# Patient Record
Sex: Male | Born: 1995 | Race: Black or African American | Hispanic: No | Marital: Single | Smoking: Never smoker
Health system: Southern US, Community
[De-identification: ages and names within clinical notes are randomized; demographics above are authoritative.]

## PROBLEM LIST (undated history)

## (undated) DIAGNOSIS — K529 Noninfective gastroenteritis and colitis, unspecified: Secondary | ICD-10-CM

---

## 2021-02-17 ENCOUNTER — Encounter (HOSPITAL_COMMUNITY): Payer: Self-pay | Admitting: Emergency Medicine

## 2021-02-17 ENCOUNTER — Ambulatory Visit (HOSPITAL_COMMUNITY): Payer: Self-pay

## 2021-02-17 ENCOUNTER — Ambulatory Visit (HOSPITAL_COMMUNITY)
Admission: EM | Admit: 2021-02-17 | Discharge: 2021-02-17 | Disposition: A | Discharge status: Home / Self Care | Payer: Self-pay | Attending: Family Medicine | Admitting: Family Medicine

## 2021-02-17 ENCOUNTER — Other Ambulatory Visit: Payer: Self-pay

## 2021-02-17 DIAGNOSIS — K6289 Other specified diseases of anus and rectum: Secondary | ICD-10-CM

## 2021-02-17 DIAGNOSIS — R1032 Left lower quadrant pain: Secondary | ICD-10-CM

## 2021-02-17 HISTORY — DX: Noninfective gastroenteritis and colitis, unspecified: K52.9

## 2021-02-17 NOTE — Discharge Instructions (Signed)

## 2021-02-17 NOTE — ED Provider Notes (Signed)
[facility] CARE CENTER   [id number] 02/17/21 Arrival Time: 1449  ASSESSMENT & PLAN:  1. Abdominal pain, left lower quadrant   2. Rectal discomfort    Benign abdominal exam. No indications for urgent abdominal/pelvic imaging at this time. He initially requests abd films but later declines; discussed low yield. Discussed. Recommend GI f/u as listed below.   Discharge Instructions     You have been seen today for abdominal pain. Your evaluation was not suggestive of any emergent condition requiring medical intervention at this time. However, some abdominal problems make take more time to appear. Therefore, it is very important for you to pay attention to any new symptoms or worsening of your current condition.  Please return here or to the Emergency Department immediately should you begin to feel worse in any way or have any of the following symptoms: increasing or different abdominal pain, persistent vomiting, inability to drink fluids, fevers, or shaking chills.       Follow-up Information    Schedule an appointment as soon as possible for a visit  with [facility].   Specialty: Gastroenterology Contact information: [address] [place] [address] [phone]       [facility] EMERGENCY DEPARTMENT.   Specialty: Emergency Medicine Why: If symptoms worsen in any way. Contact information: [address] [id number] mc [place] [address] [phone]              Reviewed expectations re: course of current medical issues. Questions answered. Outlined signs and symptoms indicating need for more acute intervention. Patient verbalized understanding. After Visit Summary given.   SUBJECTIVE: History from: patient. Derrick Hines is a 25 y.o. male who presents with complaint of intermittent LLQ abdominal discomfort and intermittent rectal discomfort. Onset gradual, past 1-2 mos. Abdominal discomfort  described as dull; without radiation; does not wake him at night. Rectal discomfort "feels like bumps sometimes". Occasional bright red blood on toilet tissue after BM. Does strain with hard BMs. Reports normal flatus. Symptoms are unchanged since beginning. Fever: absent. Aggravating factors: have not been identified. Alleviating factors: have not been identified. Associated symptoms: none reported. He denies belching, chills, dysuria and myalgias. Appetite: normal. PO intake: normal without n/v. Ambulatory without assistance. Urinary symptoms: none. Last BM this morning without bleeding. History of similar: "colitis a long time ago". OTC treatment: none PTA.  History reviewed. No pertinent surgical history.   OBJECTIVE:  Vitals:   02/17/21 1502 02/17/21 1504  BP:  125/85  Pulse:  83  Resp:  16  Temp:  (!) 95 F (35 C)  TempSrc:  Oral  SpO2:  96%  Weight: 104.3 kg   Height: 5\' 11"  (1.803 m)     General appearance: alert, oriented, no acute distress HEENT: [place]; AT; oropharynx moist Lungs: unlabored respirations Abdomen: soft; without distention; no specific tenderness to palpation; normal bowel sounds; without masses or organomegaly; without guarding or rebound tenderness Back: without reported CVA tenderness; FROM at waist Extremities: without LE edema; symmetrical; without gross deformities Skin: warm and dry Neurologic: normal gait Psychological: alert and cooperative; normal mood and affect   No Known Allergies                                             Past Medical History:  Diagnosis Date  . Colitis     Social History  Socioeconomic History  . Marital status: Single    Spouse name: Not on file  . Number of children: Not on file  . Years of education: Not on file  . Highest education level: Not on file  Occupational History  . Not on file  Tobacco Use  . Smoking status: Never Smoker  . Smokeless tobacco: Never Used  Vaping Use  . Vaping Use: Never used   Substance and Sexual Activity  . Alcohol use: Not Currently    Comment: occ  . Drug use: Never  . Sexual activity: Not on file  Other Topics Concern  . Not on file  Social History Narrative  . Not on file   Social Determinants of Health   Financial Resource Strain: Not on file  Food Insecurity: Not on file  Transportation Needs: Not on file  Physical Activity: Not on file  Stress: Not on file  Social Connections: Not on file  Intimate Partner Violence: Not on file    History reviewed. No pertinent family history.   Mardella Layman, MD 02/17/21 [phone]

## 2021-02-17 NOTE — ED Triage Notes (Signed)
Pt presents today with c/o of LLQ abdominal pain x 1-2 mos. He thinks he might have a problem in his small bowel. He is also concerned that his small intestines are coming out of his rectum. Complains of enlarged abdomen.

## 2021-04-07 ENCOUNTER — Encounter: Payer: Self-pay | Admitting: Gastroenterology

## 2021-05-06 ENCOUNTER — Ambulatory Visit (INDEPENDENT_AMBULATORY_CARE_PROVIDER_SITE_OTHER): Payer: Self-pay | Admitting: Gastroenterology

## 2021-05-06 ENCOUNTER — Encounter: Payer: Self-pay | Admitting: Gastroenterology

## 2021-05-06 VITALS — BP 118/74 | HR 58 | Ht 71.0 in | Wt 231.5 lb

## 2021-05-06 DIAGNOSIS — K589 Irritable bowel syndrome without diarrhea: Secondary | ICD-10-CM

## 2021-05-06 DIAGNOSIS — K648 Other hemorrhoids: Secondary | ICD-10-CM

## 2021-05-06 NOTE — Patient Instructions (Addendum)
If you are age 27 or older, your body mass index should be between 23-30. Your Body mass index is 32.29 kg/m. If this is out of the aforementioned range listed, please consider follow up with your Primary Care Provider.  If you are age 49 or younger, your body mass index should be between 19-25. Your Body mass index is 32.29 kg/m. If this is out of the aformentioned range listed, please consider follow up with your Primary Care Provider.   Your provider has requested that you go to the basement level for lab work before leaving today. Press "B" on the elevator. The lab is located at the first door on the left as you exit the elevator.  Start Metamucil daily.  The [facility] providers would like to encourage you to use [facility] to communicate with providers for non-urgent requests or questions.  Due to long hold times on the telephone, sending your provider a message by [facility] may be a faster and more efficient way to get a response.  Please allow 48 business hours for a response.  Please remember that this is for non-urgent requests.   Due to recent changes in healthcare laws, you may see the results of your imaging and laboratory studies on MyChart before your provider has had a chance to review them.  We understand that in some cases there may be results that are confusing or concerning to you. Not all laboratory results come back in the same time frame and the provider may be waiting for multiple results in order to interpret others.  Please give [place] 48 hours in order for your provider to thoroughly review all the results before contacting the office for clarification of your results.   It was a pleasure to see you today!  Thank you for trusting me with your gastrointestinal care!    Scott E. Tomasa Rand , MD

## 2021-05-06 NOTE — Progress Notes (Signed)
HPI : Derrick Hines is a pleasant 25 year old male presenting to our clinic with chronic abdominal pain, irregular bowel habits and perianal symptoms.  He states that his bowel movements fluctuate between periods of constipation and diarrhea.  For the past 2 days, he has had diarrhea, including 4 nocturnal bowel movements.  Typically, he will have diarrhea for a few days, and then it will resolve.  He may have periods of normal stools, but will also have periods of hard stools with straining.  He has seen blood on occasion with the hard stools.  His diarrhea symptoms seem to be more frequent than his constipation.  He went to the ER in [place] last year with symptoms of acute diarrhea and was told her had colitis.  He went to the ER in May of this year with abdominal pain, but declined imaging. He reports having these symptoms off and on for the past 2-3 years.  He also reports problems with perianal pain and discomfort, variably described as a itching/burning sensation as well 'contractions' of the anal sphincter.  He denies pain with the passage of stool or pain with wiping.  He reports a history consistent with Grade III hemorrhoids, in which he has manually reduce prolapsed tissue.  He states that he has reduce the hemorrhoids with about 50 % of his bowel movements. No weight loss.  No family history of GI illnesses or malignancies.   Past Medical History:  Diagnosis Date   Colitis      History reviewed. No pertinent surgical history. Family History  Problem Relation Age of Onset   Cancer - Lung Maternal Uncle    Social History   Tobacco Use   Smoking status: Never   Smokeless tobacco: Never  Vaping Use   Vaping Use: Never used  Substance Use Topics   Alcohol use: Not Currently    Comment: occ   Drug use: Never   No current outpatient medications on file.   No current facility-administered medications for this visit.   No Known Allergies   Review of Systems: All systems  reviewed and negative except where noted in HPI.    No results found.  Physical Exam: BP 118/74   Pulse (!) 58   Ht 5\' 11"  (1.803 m)   Wt 231 lb 8 oz (105 kg)   SpO2 96%   BMI 32.29 kg/m  Constitutional: Pleasant,well-developed, African male in no acute distress. HEENT: Normocephalic and atraumatic. Conjunctivae are normal. No scleral icterus. Neck supple.  Cardiovascular: Normal rate, regular rhythm.  Pulmonary/chest: Effort normal and breath sounds normal. No wheezing, rales or rhonchi. Abdominal: Soft, nondistended, nontender. Bowel sounds active throughout. There are no masses palpable. No hepatomegaly. Extremities: no edema Lymphadenopathy: No cervical adenopathy noted. Neurological: Alert and oriented to person place and time. Skin: Skin is warm and dry. No rashes noted. Psychiatric: Normal mood and affect. Behavior is normal. Rectal:  Perianal exam unremarkable; no skin rashes, skin tags/external hemorrhoids or anal fissures.  Digital rectal exam was performed but was incomplete due to patient intolerance.  The anal canal was palpated and was elongated, but the rectal vault was not examined.  CMA Derrick Hines was present during this examination  CBC No results found for: WBC, RBC, HGB, HCT, PLT, MCV, MCH, MCHC, RDW, LYMPHSABS, MONOABS, EOSABS, BASOSABS  CMP  No results found for: NA, K, CL, CO2, GLUCOSE, BUN, CREATININE, CALCIUM, PROT, ALBUMIN, AST, ALT, ALKPHOS, BILITOT, GFRNONAA, GFRAA   ASSESSMENT AND PLAN: 25 year old  male with 2+ year history of irregular bowel habits and abdominal discomfort, as well as perianal symptoms most consistent with Grade III internal hemorrhoids.  We discussed the proposed pathophysiology of IBS and gut brain axis disorders in general.  We discussed management of IBS, to include use of medications to improve bowel habits, as needed pain medicine, centrally acting neuromodulators, role of empiric dietary modifications to include a low FODMAP  diet gluten-free diet, as well as the role of cognitive therapies.  We discussed the goals of IBS management, namely to minimize the impact of GI symptoms on quality of life.  I emphasized that it would be unreasonable for the patient to expect completely normal bowel habits and no abdominal discomfort. The patient was provided handouts with further information on IBS.  I recommended he start out with taking a daily psyllium based fiber supplement to provide better stool consistency and regularity.  Will will rule out H. Pylori and celiac disease. We also discussed the pathophysiology of hemorrhoids and the principles of management through optimization of bowel habits and stool consistency, and also the role of hemorrhoid banding.  I informed him that it would be unlikely for Grade III hemorrhoids to completely resolve with improved bowel habits alone and I recommended banding for him.  However, given his intolerance of the digital rectal exam, it is highly unlikely he would tolerate hemorrhoid banding.  He agreed and declined any other intervention for his hemorrhoids at this time.  IBS - H. Pylori stool antigen, TTG/IgA - Fiber  Grade III hemorrhoids  - Fiber - Reconsider banding if no improvement with fiber supplementation   Derrick Rena E. Tomasa Rand, MD [facility]

## 2021-07-16 ENCOUNTER — Ambulatory Visit: Payer: Self-pay | Admitting: Gastroenterology

## 2021-07-31 ENCOUNTER — Other Ambulatory Visit: Payer: Self-pay

## 2021-07-31 ENCOUNTER — Encounter (HOSPITAL_COMMUNITY): Payer: Self-pay | Admitting: *Deleted

## 2021-07-31 ENCOUNTER — Ambulatory Visit (HOSPITAL_COMMUNITY)
Admission: EM | Admit: 2021-07-31 | Discharge: 2021-07-31 | Disposition: A | Discharge status: Home / Self Care | Payer: Self-pay | Attending: Medical Oncology | Admitting: Medical Oncology

## 2021-07-31 DIAGNOSIS — J4521 Mild intermittent asthma with (acute) exacerbation: Secondary | ICD-10-CM

## 2021-07-31 MED ORDER — BENZONATATE 100 MG PO CAPS: 100.0000 mg | ORAL_CAPSULE | Freq: Three times a day (TID) | ORAL | 0 refills | Status: DC

## 2021-07-31 MED ORDER — ALBUTEROL SULFATE HFA 108 (90 BASE) MCG/ACT IN AERS: 1.0000 | INHALATION_SPRAY | Freq: Four times a day (QID) | RESPIRATORY_TRACT | 0 refills | Status: DC | PRN

## 2021-07-31 MED ORDER — FLUTICASONE PROPIONATE 50 MCG/ACT NA SUSP: 2.0000 | Freq: Every day | NASAL | 0 refills | Status: AC

## 2021-07-31 NOTE — ED Triage Notes (Signed)
Pt reports productive cough for one week

## 2021-07-31 NOTE — ED Provider Notes (Signed)
[facility]    CSN: [id number] Arrival date & time: 07/31/21  1534      History   Chief Complaint Chief Complaint  Patient presents with   Cough    HPI Derrick Hines is a 25 y.o. male.   HPI  Cough: Pt presents with a productive cough for the past week. He reports that the cough mainly occurs if he is around people that are smoking. He is not around this very often but has been recently. No fevers, SOB, hemoptysis. He has tried allergy medications without much relief. 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 No sick contacts.    Past Medical History:  Diagnosis Date   Colitis     There are no problems to display for this patient.   History reviewed. No pertinent surgical history.    Home Medications    Prior to Admission medications   Not on File    Family History Family History  Problem Relation Age of Onset   Cancer - Lung Maternal Uncle     Social History Social History   Tobacco Use   Smoking status: Never   Smokeless tobacco: Never  Vaping Use   Vaping Use: Never used  Substance Use Topics   Alcohol use: Not Currently    Comment: occ   Drug use: Never     Allergies   Patient has no known allergies.   Review of Systems Review of Systems  As stated above in HPI Physical Exam Triage Vital Signs ED Triage Vitals  Enc Vitals Group     BP 07/31/21 1645 126/88     Pulse Rate 07/31/21 1645 69     Resp 07/31/21 1645 20     Temp 07/31/21 1645 99.4 F (37.4 C)     Temp src --      SpO2 07/31/21 1645 98 %     Weight --      Height --      Head Circumference --      Peak Flow --      Pain Score 07/31/21 1646 0     Pain Loc --      Pain Edu? --      Excl. in GC? --    No data found.  Updated Vital Signs BP 126/88   Pulse 69   Temp 99.4 F (37.4 C)   Resp 20   SpO2 98%   Physical Exam Vitals and nursing note reviewed.  Constitutional:      General: He is not in acute distress.    Appearance: Normal appearance. He is  not ill-appearing, toxic-appearing or diaphoretic.  HENT:     Head: Normocephalic and atraumatic.     Right Ear: Tympanic membrane normal.     Left Ear: Tympanic membrane normal.     Nose: Congestion (mild and erythema) present.     Mouth/Throat:     Mouth: Mucous membranes are moist.     Pharynx: No oropharyngeal exudate or posterior oropharyngeal erythema.  Eyes:     Extraocular Movements: Extraocular movements intact.     Pupils: Pupils are equal, round, and reactive to light.  Cardiovascular:     Rate and Rhythm: Normal rate and regular rhythm.     Heart sounds: Normal heart sounds.  Pulmonary:     Effort: Pulmonary effort is normal.     Breath sounds: Normal breath sounds.  Musculoskeletal:     Cervical back: Normal range of motion and neck supple.  Skin:  General: Skin is warm.  Neurological:     Mental Status: He is alert and oriented to person, place, and time.     UC Treatments / Results  Labs (all labs ordered are listed, but only abnormal results are displayed) Labs Reviewed - No data to display  EKG   Radiology No results found.  Procedures Procedures (including critical care time)  Medications Ordered in UC Medications - No data to display  Initial Impression / Assessment and Plan / UC Course  I have reviewed the triage vital signs and the nursing notes.  Pertinent labs & imaging results that were available during my care of the patient were reviewed by me and considered in my medical decision making (see chart for details).     New.  I discussed with patient that this likely is aggravating his RAD.  I prescribed him albuterol along with Flonase and Tessalon as appears he may also have a slight virus that is causing his symptoms as well. His O2 is stable and he has no SOB or wheezing so we have elected to hold off on steroids at this time. Discussed red flag signs and symptoms.  Follow-up as needed. Final Clinical Impressions(s) / UC Diagnoses    Final diagnoses:  None   Discharge Instructions   None    ED Prescriptions   None    PDMP not reviewed this encounter.   Rushie Chestnut, [place] 07/31/21 [phone]

## 2021-08-13 ENCOUNTER — Ambulatory Visit (INDEPENDENT_AMBULATORY_CARE_PROVIDER_SITE_OTHER): Payer: Self-pay

## 2021-08-13 ENCOUNTER — Ambulatory Visit (HOSPITAL_COMMUNITY)
Admission: EM | Admit: 2021-08-13 | Discharge: 2021-08-13 | Disposition: A | Discharge status: Home / Self Care | Payer: Self-pay | Attending: Family Medicine | Admitting: Family Medicine

## 2021-08-13 ENCOUNTER — Other Ambulatory Visit: Payer: Self-pay

## 2021-08-13 ENCOUNTER — Encounter (HOSPITAL_COMMUNITY): Payer: Self-pay | Admitting: Emergency Medicine

## 2021-08-13 DIAGNOSIS — J4530 Mild persistent asthma, uncomplicated: Secondary | ICD-10-CM

## 2021-08-13 DIAGNOSIS — R059 Cough, unspecified: Secondary | ICD-10-CM

## 2021-08-13 MED ORDER — PREDNISONE 20 MG PO TABS: 40.0000 mg | ORAL_TABLET | Freq: Every day | ORAL | 0 refills | Status: DC

## 2021-08-13 MED ORDER — ALBUTEROL SULFATE HFA 108 (90 BASE) MCG/ACT IN AERS: 1.0000 | INHALATION_SPRAY | Freq: Four times a day (QID) | RESPIRATORY_TRACT | 0 refills | Status: DC | PRN

## 2021-08-13 MED ORDER — FAMOTIDINE 20 MG PO TABS: 20.0000 mg | ORAL_TABLET | Freq: Every day | ORAL | 0 refills | Status: AC

## 2021-08-13 NOTE — Discharge Instructions (Addendum)
Suspect symptoms may be related to acid reflux or an irritant as the source of recurrent cough. You may warrant further work-up by Primary care provider and ultimate a asthma/allergy specialist   Recommend a trial of famotidine 20 mg BID daily x 7 and prednisone 40 mg daily for 5 days. I refilled your albuterol inhaler continue to use 2 puffs every 6 hours as needed for shortness of breath and or wheezing

## 2021-08-13 NOTE — ED Provider Notes (Signed)
[facility]    CSN: [id number] Arrival date & time: 08/13/21  1301      History   Chief Complaint Chief Complaint  Patient presents with   Cough    HPI Derrick Hines is a 25 y.o. male.   HPI Patient presents for follow-up evaluation of non productive cough x 1 month. He endorses "SOB" at night and upon awakening in the morning. He was treated conservatively for URI for which he reports no relief. He endorses throat discomfort while coughing. Denies any nasal symptoms. He was prescribed an albuterol inhaler during his recent visit here at [facility] and reports minimum relief. He reports that he is non-smoker but is around others that smoke which he identifies as a trigger of cough. He is requesting penicillin as he feel he has an infection.   Past Medical History:  Diagnosis Date   Colitis     There are no problems to display for this patient.   History reviewed. No pertinent surgical history.     Home Medications    Prior to Admission medications   Medication Sig Start Date End Date Taking? Authorizing Provider  albuterol (VENTOLIN HFA) 108 (90 Base) MCG/ACT inhaler Inhale 1-2 puffs into the lungs every 6 (six) hours as needed for wheezing or shortness of breath. 08/13/21  Yes Bing Neighbors, FNP  benzonatate (TESSALON) 100 MG capsule Take 1 capsule (100 mg total) by mouth every 8 (eight) hours. 07/31/21  Yes Covington, Sarah M, PA-C  famotidine (PEPCID) 20 MG tablet Take 1 tablet (20 mg total) by mouth daily for 7 days. 08/13/21 08/20/21 Yes Bing Neighbors, FNP  predniSONE (DELTASONE) 20 MG tablet Take 2 tablets (40 mg total) by mouth daily with breakfast. 08/13/21  Yes Bing Neighbors, FNP  fluticasone (FLONASE) 50 MCG/ACT nasal spray Place 2 sprays into both nostrils daily. 07/31/21   Rushie Chestnut, PA-C    Family History Family History  Problem Relation Age of Onset   Cancer - Lung Maternal Uncle     Social History Social History    Tobacco Use   Smoking status: Never   Smokeless tobacco: Never  Vaping Use   Vaping Use: Never used  Substance Use Topics   Alcohol use: Not Currently    Comment: occ   Drug use: Never     Allergies   Patient has no known allergies.   Review of Systems Review of Systems Pertinent negatives listed in HPI   Physical Exam Triage Vital Signs ED Triage Vitals  Enc Vitals Group     BP 08/13/21 1442 (!) 145/70     Pulse Rate 08/13/21 1442 65     Resp 08/13/21 1442 16     Temp 08/13/21 1442 98.8 F (37.1 C)     Temp Source 08/13/21 1442 Oral     SpO2 08/13/21 1442 100 %     Weight --      Height --      Head Circumference --      Peak Flow --      Pain Score 08/13/21 1446 0     Pain Loc --      Pain Edu? --      Excl. in GC? --    No data found.  Updated Vital Signs BP (!) 145/70 (BP Location: Left Arm)   Pulse 65   Temp 98.8 F (37.1 C) (Oral)   Resp 16   SpO2 100%   Visual Acuity Right Eye Distance:  Left Eye Distance:   Bilateral Distance:    Right Eye Near:   Left Eye Near:    Bilateral Near:     Physical Exam Constitutional:      Appearance: Normal appearance.  HENT:     Head: Normocephalic.  Eyes:     Extraocular Movements: Extraocular movements intact.     Pupils: Pupils are equal, round, and reactive to light.  Cardiovascular:     Rate and Rhythm: Normal rate and regular rhythm.  Pulmonary:     Effort: Pulmonary effort is normal.     Breath sounds: Normal breath sounds.  Musculoskeletal:        General: Normal range of motion.  Skin:    General: Skin is warm and dry.     Capillary Refill: Capillary refill takes less than 2 seconds.  Neurological:     General: No focal deficit present.     Mental Status: He is alert and oriented to person, place, and time.  Psychiatric:        Mood and Affect: Mood normal.        Behavior: Behavior normal.     UC Treatments / Results  Labs (all labs ordered are listed, but only abnormal  results are displayed) Labs Reviewed - No data to display  EKG   Radiology No results found.  Procedures Procedures (including critical care time)  Medications Ordered in UC Medications - No data to display  Initial Impression / Assessment and Plan / UC Course  I have reviewed the triage vital signs and the nursing notes.  Pertinent labs & imaging results that were available during my care of the patient were reviewed by me and considered in my medical decision making (see chart for details).    Reactive airway disease vs GERD Trial famotidine 20 mg daily x7 days along with prednisone 40 mg daily with breakfast. Continue albuterol inhaler if this is providing you relief of any symptoms of shortness of breath or  restrictive airway type symptoms.   If symptoms persist would recommend further evaluation by primary care provider and possibly referral to an asthma allergy specialist. Final Clinical Impressions(s) / UC Diagnoses   Final diagnoses:  Mild persistent reactive airway disease without complication     Discharge Instructions      Suspect symptoms may be related to acid reflux or an irritant as the source of recurrent cough. You may warrant further work-up by Primary care provider and ultimate a asthma/allergy specialist   Recommend a trial of famotidine 20 mg BID daily x 7 and prednisone 40 mg daily for 5 days. I refilled your albuterol inhaler continue to use 2 puffs every 6 hours as needed for shortness of breath and or wheezing      ED Prescriptions     Medication Sig Dispense Auth. Provider   famotidine (PEPCID) 20 MG tablet Take 1 tablet (20 mg total) by mouth daily for 7 days. 7 tablet Bing Neighbors, FNP   predniSONE (DELTASONE) 20 MG tablet Take 2 tablets (40 mg total) by mouth daily with breakfast. 10 tablet Bing Neighbors, FNP   albuterol (VENTOLIN HFA) 108 (90 Base) MCG/ACT inhaler Inhale 1-2 puffs into the lungs every 6 (six) hours as needed  for wheezing or shortness of breath. 1 each Bing Neighbors, FNP      PDMP not reviewed this encounter.   Bing Neighbors, FNP 08/16/21 1154

## 2021-08-13 NOTE — ED Triage Notes (Addendum)
Patient c/o nonproductive cough x 1 month.   Patient denies fever or chest pain.   Patient endorses SOB at night time and early morning.   Patient endorses throat pain when coughing.   Patient endorses " I came to clinic last week, I was told I had a respiratory infection, given tessalon pearls and Albuterol" with no relief of symptoms.   Patient denies Asthma History.

## 2022-05-04 ENCOUNTER — Other Ambulatory Visit: Payer: Self-pay | Admitting: Gastroenterology

## 2022-05-04 DIAGNOSIS — K648 Other hemorrhoids: Secondary | ICD-10-CM

## 2022-05-04 DIAGNOSIS — K589 Irritable bowel syndrome without diarrhea: Secondary | ICD-10-CM

## 2022-11-27 ENCOUNTER — Ambulatory Visit (INDEPENDENT_AMBULATORY_CARE_PROVIDER_SITE_OTHER): Payer: No Typology Code available for payment source

## 2022-11-27 ENCOUNTER — Encounter (HOSPITAL_COMMUNITY): Payer: Self-pay | Admitting: Emergency Medicine

## 2022-11-27 ENCOUNTER — Other Ambulatory Visit: Payer: Self-pay

## 2022-11-27 ENCOUNTER — Ambulatory Visit (HOSPITAL_COMMUNITY)
Admission: EM | Admit: 2022-11-27 | Discharge: 2022-11-27 | Disposition: A | Discharge status: Home / Self Care | Payer: No Typology Code available for payment source | Attending: Physician Assistant | Admitting: Physician Assistant

## 2022-11-27 DIAGNOSIS — R059 Cough, unspecified: Secondary | ICD-10-CM

## 2022-11-27 DIAGNOSIS — J069 Acute upper respiratory infection, unspecified: Secondary | ICD-10-CM

## 2022-11-27 DIAGNOSIS — J209 Acute bronchitis, unspecified: Secondary | ICD-10-CM | POA: Diagnosis not present

## 2022-11-27 DIAGNOSIS — R0989 Other specified symptoms and signs involving the circulatory and respiratory systems: Secondary | ICD-10-CM

## 2022-11-27 MED ORDER — ALBUTEROL SULFATE HFA 108 (90 BASE) MCG/ACT IN AERS: 1.0000 | INHALATION_SPRAY | Freq: Four times a day (QID) | RESPIRATORY_TRACT | 1 refills | Status: DC | PRN

## 2022-11-27 MED ORDER — DM-GUAIFENESIN ER 30-600 MG PO TB12: 1.0000 | ORAL_TABLET | Freq: Two times a day (BID) | ORAL | 0 refills | Status: AC

## 2022-11-27 MED ORDER — SPACER/AERO-HOLDING CHAMBERS DEVI: 1.0000 [IU] | Freq: Three times a day (TID) | 0 refills | Status: AC

## 2022-11-27 NOTE — ED Triage Notes (Signed)
Patient reports symptoms started a month ago.  Patient reports being seen at a ucc in [place].  Patient was given cough medicine ( pills), and a steroids.  Patient does not think medicine helped at all.  Continue to have a cough.  Non-productive cough and complains of "a lot of bronchospasms" Patient does not have a pcp

## 2022-11-27 NOTE — Discharge Instructions (Addendum)
Advised take the Mucinex DM every 12 hours to help control cough and congestion. Advised to use the albuterol inhaler with spacer, 2 puffs every 6 hours on a regular basis to help control bronchospasm and to improve the bronchial symptoms.  Advised follow-up PCP or return to urgent care if symptoms fail to improve.

## 2022-11-27 NOTE — ED Provider Notes (Signed)
[place]    CSN: [place]:[id number] Arrival date & time: 11/27/22  1217      History   Chief Complaint Chief Complaint  Patient presents with   Cough    HPI Derrick Hines is a 27 y.o. male.   27 year old male presents with cough.  Patient indicates for the past month he has been having persistent cough with coughing spasms.  He indicates that the spasms are worse at night when he tries to sleep.  He does indicate he has some during the day but not as frequent or severe at nighttime.  The patient [name] that he was seen at an urgent care in [place] at the first of the month and evaluated for his symptoms.  He indicates that he was taking some Tessalon Perles which were not effective against his cough and he also took a [employer] which also did not help his symptoms.  Patient indicates he does not smoke but he is around friends that are smoking so he is breathing secondhand smoke.  Patient indicates he has not been having wheezing associated with the cough but he does occasionally have shortness of breath.  He indicates he has not had fever, chills, sweats, body aches.  He indicates he has not had any upper respiratory symptoms.  He is tolerating fluids well.   Cough   Past Medical History:  Diagnosis Date   Colitis     There are no problems to display for this patient.   History reviewed. No pertinent surgical history.     Home Medications    Prior to Admission medications   Medication Sig Start Date End Date Taking? Authorizing Provider  dextromethorphan-guaiFENesin (MUCINEX DM) 30-600 MG 12hr tablet Take 1 tablet by mouth 2 (two) times daily. 11/27/22  Yes Nyoka Lint, PA-C  Spacer/Aero-Holding Chambers DEVI 1 Units by Does not apply route in the morning, at noon, and at bedtime. 11/27/22  Yes Nyoka Lint, PA-C  albuterol (VENTOLIN HFA) 108 (90 Base) MCG/ACT inhaler Inhale 1-2 puffs into the lungs every 6 (six) hours as needed for wheezing or shortness  of breath. 11/27/22   Nyoka Lint, PA-C  benzonatate (TESSALON) 100 MG capsule Take 1 capsule (100 mg total) by mouth every 8 (eight) hours. Patient not taking: Reported on 11/27/2022 07/31/21   Hughie Closs, PA-C  famotidine (PEPCID) 20 MG tablet Take 1 tablet (20 mg total) by mouth daily for 7 days. Patient not taking: Reported on 11/27/2022 08/13/21 08/20/21  Scot Jun, NP  fluticasone [facility]) 50 MCG/ACT nasal spray Place 2 sprays into both nostrils daily. Patient not taking: Reported on 11/27/2022 07/31/21   Hughie Closs, PA-C  predniSONE (DELTASONE) 20 MG tablet Take 2 tablets (40 mg total) by mouth daily with breakfast. Patient not taking: Reported on 11/27/2022 08/13/21   Scot Jun, NP    Family History Family History  Problem Relation Age of Onset   Cancer - Lung Maternal Uncle     Social History Social History   Tobacco Use   Smoking status: Never   Smokeless tobacco: Never  Vaping Use   Vaping Use: Never used  Substance Use Topics   Alcohol use: Yes    Comment: occ   Drug use: Never     Allergies   Patient has no known allergies.   Review of Systems Review of Systems  Respiratory:  Positive for cough.      Physical Exam Triage Vital Signs ED Triage Vitals  Enc Vitals  Group     BP 11/27/22 1325 118/80     Pulse Rate 11/27/22 1325 79     Resp 11/27/22 1325 20     Temp 11/27/22 1325 98.4 F (36.9 C)     Temp Source 11/27/22 1325 Oral     SpO2 11/27/22 1325 98 %     Weight --      Height --      Head Circumference --      Peak Flow --      Pain Score 11/27/22 1322 2     Pain Loc --      Pain Edu? --      Excl. in [place]? --    No data found.  Updated Vital Signs BP 118/80 (BP Location: Left Arm) Comment (BP Location): large cuff  Pulse 79   Temp 98.4 F (36.9 C) (Oral)   Resp 20   SpO2 98%   Visual Acuity Right Eye Distance:   Left Eye Distance:   Bilateral Distance:    Right Eye Near:   Left Eye Near:     Bilateral Near:     Physical Exam Constitutional:      Appearance: Normal appearance.  HENT:     Right Ear: Tympanic membrane and ear canal normal.     Left Ear: Tympanic membrane and ear canal normal.     Mouth/Throat:     Mouth: Mucous membranes are moist.     Pharynx: Oropharynx is clear.  Cardiovascular:     Rate and Rhythm: Normal rate and regular rhythm.     Heart sounds: Normal heart sounds.  Pulmonary:     Effort: Pulmonary effort is normal.     Breath sounds: Normal breath sounds and air entry. No wheezing, rhonchi or rales.  Lymphadenopathy:     Cervical: No cervical adenopathy.  Neurological:     Mental Status: He is alert.      UC Treatments / Results  Labs (all labs ordered are listed, but only abnormal results are displayed) Labs Reviewed - No data to display  EKG   Radiology DG Chest 2 View  Result Date: 11/27/2022 CLINICAL DATA:  Cough and chest congestion for 1 month. EXAM: CHEST - 2 VIEW COMPARISON:  08/13/2021 FINDINGS: The heart size and mediastinal contours are within normal limits. Both lungs are clear. The visualized skeletal structures are unremarkable. IMPRESSION: Normal exam. Electronically Signed   By: Marlaine Hind M.D.   On: 11/27/2022 13:58    Procedures Procedures (including critical care time)  Medications Ordered in UC Medications - No data to display  Initial Impression / Assessment and Plan / UC Course  I have reviewed the triage vital signs and the nursing notes.  Pertinent labs & imaging results that were available during my care of the patient were reviewed by me and considered in my medical decision making (see chart for details).    Plan: The diagnosis will be to treated with the following: 1.  Upper respiratory tract infection: A.  Mucinex DM every 12 hours to control cough and congestion. 2.  Acute bronchitis: A.  Albuterol inhaler with spacer, 2 puffs every 6 hours on a regular basis to control bronchospasm and  congestion. 3.  Advised follow-up PCP or return to urgent care as needed. Final Clinical Impressions(s) / UC Diagnoses   Final diagnoses:  Viral upper respiratory tract infection  Acute bronchitis, unspecified organism     Discharge Instructions      Advised take the Mucinex  DM every 12 hours to help control cough and congestion. Advised to use the albuterol inhaler with spacer, 2 puffs every 6 hours on a regular basis to help control bronchospasm and to improve the bronchial symptoms.  Advised follow-up PCP or return to urgent care if symptoms fail to improve.     ED Prescriptions     Medication Sig Dispense Auth. Provider   albuterol (VENTOLIN HFA) 108 (90 Base) MCG/ACT inhaler Inhale 1-2 puffs into the lungs every 6 (six) hours as needed for wheezing or shortness of breath. 1 each Nyoka Lint, PA-C   Spacer/Aero-Holding Chambers DEVI 1 Units by Does not apply route in the morning, at noon, and at bedtime. 1 Units Nyoka Lint, PA-C   dextromethorphan-guaiFENesin [facility] DM) 30-600 MG 12hr tablet Take 1 tablet by mouth 2 (two) times daily. 20 tablet Nyoka Lint, PA-C      PDMP not reviewed this encounter.   Nyoka Lint, PA-C 11/27/22 [phone]

## 2023-01-22 IMAGING — DX DG CHEST 2V
2 series · 2 of 2 positions shown · non-contrast
Comparison: None.

CLINICAL DATA: Nonproductive cough for the past month.

EXAM:
CHEST - 2 VIEW

[chest pa]
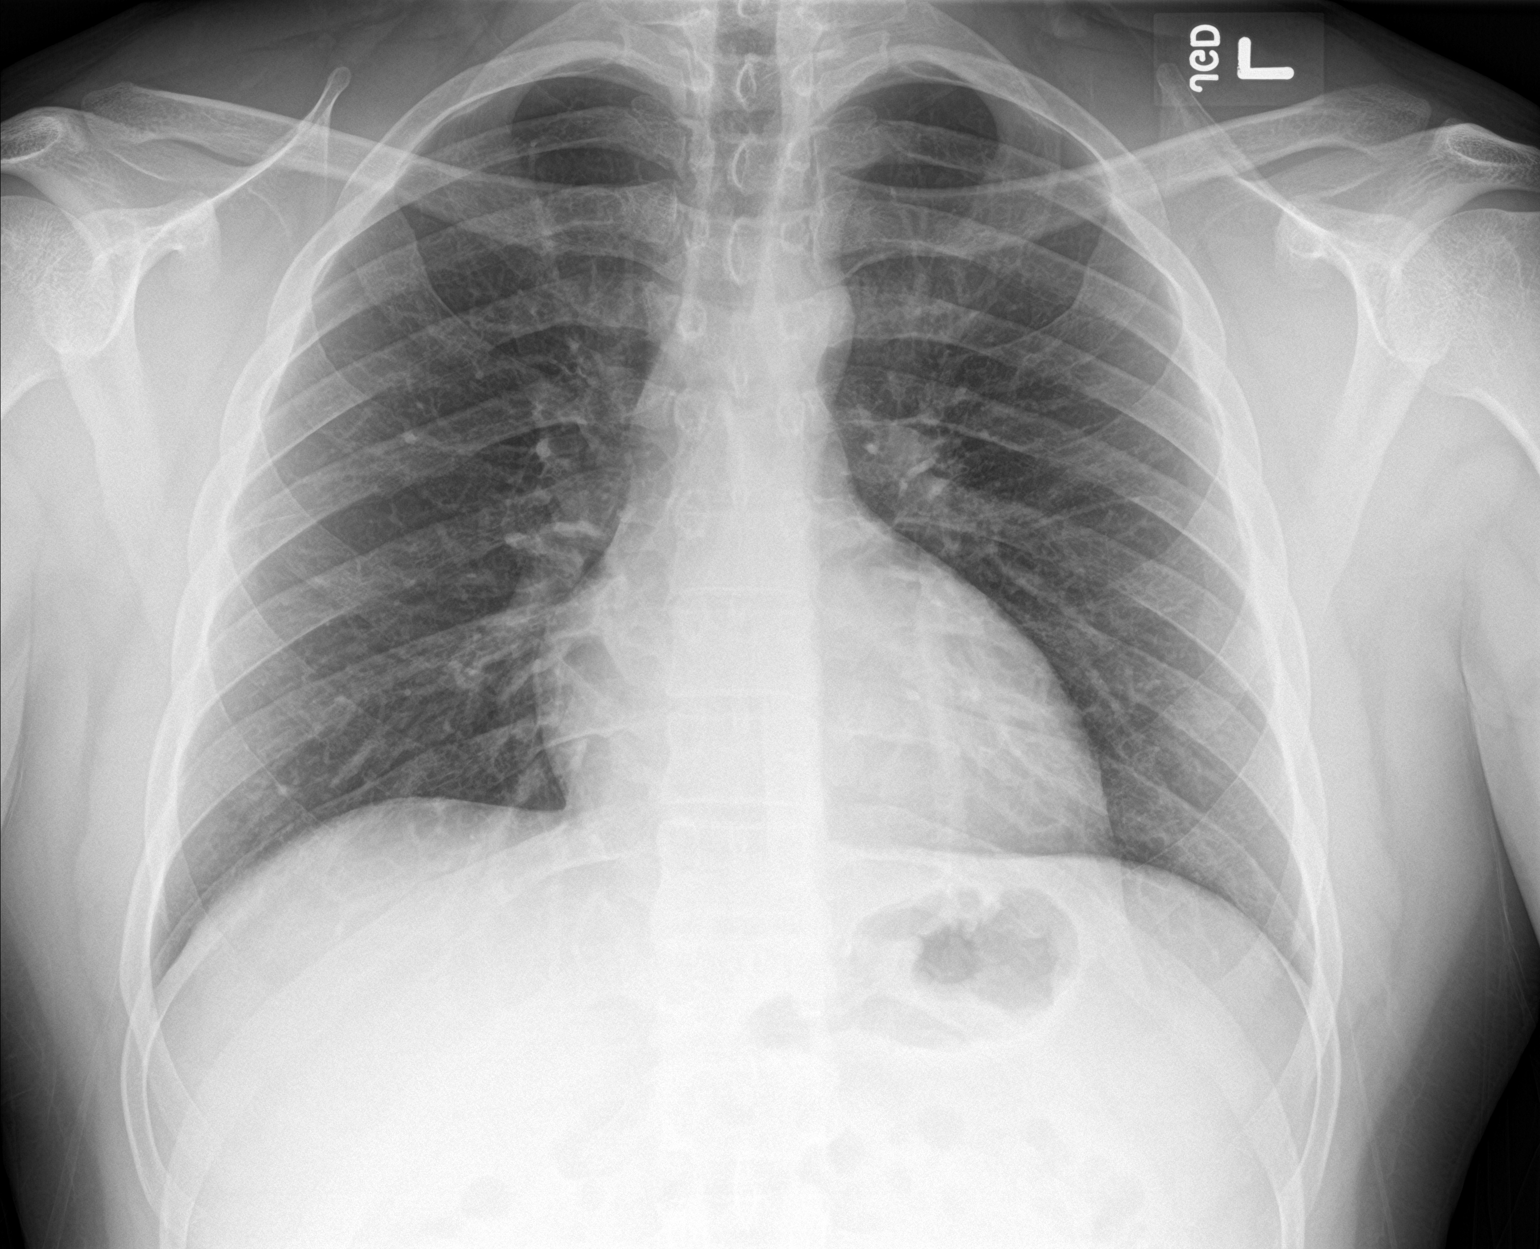

[chest lat]
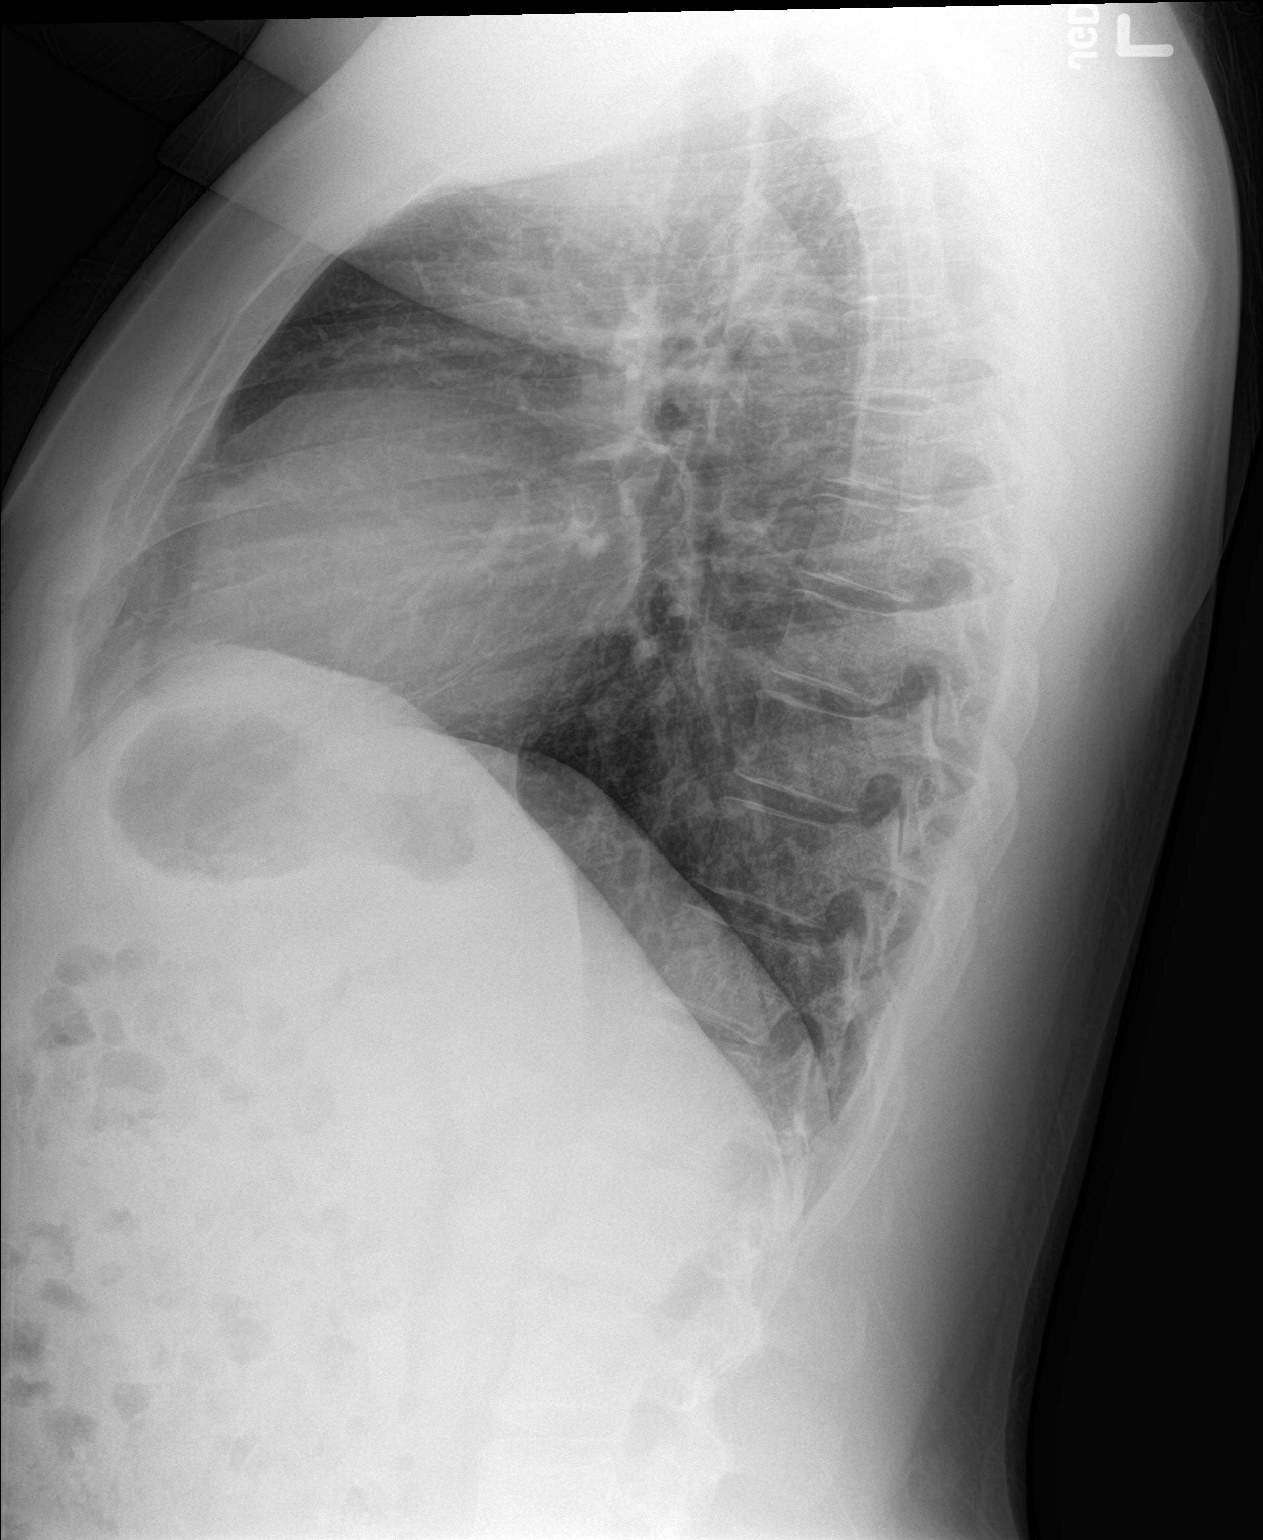

[2 of 2 positions shown; findings below may reference images not displayed]

FINDINGS: The heart size and mediastinal contours are within normal limits.
Both lungs are clear. The visualized skeletal structures are
unremarkable.
IMPRESSION: No active cardiopulmonary disease.

## 2023-02-04 ENCOUNTER — Ambulatory Visit (HOSPITAL_COMMUNITY)
Admission: EM | Admit: 2023-02-04 | Discharge: 2023-02-04 | Disposition: A | Discharge status: Home / Self Care | Payer: No Typology Code available for payment source | Attending: Physician Assistant | Admitting: Physician Assistant

## 2023-02-04 ENCOUNTER — Encounter (HOSPITAL_COMMUNITY): Payer: Self-pay

## 2023-02-04 DIAGNOSIS — K0889 Other specified disorders of teeth and supporting structures: Secondary | ICD-10-CM

## 2023-02-04 MED ORDER — AMOXICILLIN-POT CLAVULANATE 875-125 MG PO TABS: 1.0000 | ORAL_TABLET | Freq: Two times a day (BID) | ORAL | 0 refills | Status: DC

## 2023-02-04 MED ORDER — IBUPROFEN 600 MG PO TABS: 600.0000 mg | ORAL_TABLET | Freq: Four times a day (QID) | ORAL | 0 refills | Status: AC | PRN

## 2023-02-04 NOTE — ED Triage Notes (Signed)
Pt states that he's having dental pain on the left side. Bleeds when he eats. Pain started a month ago. Pain 2/10. Took ibu and tylenol for pain.

## 2023-02-04 NOTE — Discharge Instructions (Signed)
Good to meet you today.  I do not see any significant abscess or acute dental issues.  You may have a deeper infection causing the pain, which I do recommend starting Augmentin to prevent further infection.  You may take ibuprofen as directed and also alternate this with Tylenol.  You need to follow-up with a dentist, please follow-up with resources provided.  You may rinse with salt water gargles.  Soft foods only.

## 2023-02-04 NOTE — ED Provider Notes (Signed)
[facility]   MRN: [id number] DOB: [birth year]  Subjective:   Derrick Hines is a 27 y.o. male presenting for left upper dental pain.  Pain started about 1 month ago.  Rates it 2 out of 10 today.  Has been taking Tylenol and ibuprofen as needed for pain.  Sometimes has some bleeding when he is eating.  Denies any fever or chills.  No injury to tooth.  He does not have a dentist.  No current facility-administered medications for this encounter.  Current Outpatient Medications:    albuterol (VENTOLIN HFA) 108 (90 Base) MCG/ACT inhaler, Inhale 1-2 puffs into the lungs every 6 (six) hours as needed for wheezing or shortness of breath., Disp: 1 each, Rfl: 1   amoxicillin-clavulanate (AUGMENTIN) 875-125 MG tablet, Take 1 tablet by mouth every 12 (twelve) hours., Disp: 14 tablet, Rfl: 0   benzonatate (TESSALON) 100 MG capsule, Take 1 capsule (100 mg total) by mouth every 8 (eight) hours., Disp: 21 capsule, Rfl: 0   dextromethorphan-guaiFENesin (MUCINEX DM) 30-600 MG 12hr tablet, Take 1 tablet by mouth 2 (two) times daily., Disp: 20 tablet, Rfl: 0   fluticasone (FLONASE) 50 MCG/ACT nasal spray, Place 2 sprays into both nostrils daily., Disp: 16 mL, Rfl: 0   ibuprofen (ADVIL) 600 MG tablet, Take 1 tablet (600 mg total) by mouth every 6 (six) hours as needed for moderate pain., Disp: 20 tablet, Rfl: 0   predniSONE (DELTASONE) 20 MG tablet, Take 2 tablets (40 mg total) by mouth daily with breakfast., Disp: 10 tablet, Rfl: 0   Spacer/Aero-Holding Chambers DEVI, 1 Units by Does not apply route in the morning, at noon, and at bedtime., Disp: 1 Units, Rfl: 0   famotidine (PEPCID) 20 MG tablet, Take 1 tablet (20 mg total) by mouth daily for 7 days. (Patient not taking: Reported on 11/27/2022), Disp: 7 tablet, Rfl: 0   No Known Allergies  Past Medical History:  Diagnosis Date   Colitis      History reviewed. No pertinent surgical history.  Family History  Problem Relation Age of Onset    Cancer - Lung Maternal Uncle     Social History   Tobacco Use   Smoking status: Never   Smokeless tobacco: Never  Vaping Use   Vaping Use: Never used  Substance Use Topics   Alcohol use: Yes    Comment: occ   Drug use: Never    ROS REFER TO HPI FOR PERTINENT POSITIVES AND NEGATIVES   Objective:   Vitals: BP (!) 163/96 (BP Location: Left Arm)   Pulse (!) 56   Temp 98.2 F (36.8 C) (Oral)   Resp 16   Wt 235 lb (106.6 kg)   SpO2 99%   BMI 32.78 kg/m   Physical Exam Vitals and nursing note reviewed.  Constitutional:      Appearance: Normal appearance.  HENT:     Mouth/Throat:   Cardiovascular:     Rate and Rhythm: Normal rate and regular rhythm.  Pulmonary:     Effort: Pulmonary effort is normal.     Breath sounds: Normal breath sounds.  Neurological:     General: No focal deficit present.     Mental Status: He is alert and oriented to person, place, and time.  Psychiatric:        Mood and Affect: Mood normal.     No results found for this or any previous visit (from the past 24 hour(s)).  Assessment and Plan :   I  have reviewed the PDMP during this encounter.  1. Pain, dental    Plan to start patient on Augmentin as directed.  Gave ibuprofen 600 mg to take for moderate to severe pain.  He can alternate this with Tylenol.  Salt water gargles.  Soft foods.  Gave information show follow-up with a dentist as soon as possible.  Return precautions advised.    AllwardtCrist Infante, PA-C 02/04/23 1519

## 2023-12-06 ENCOUNTER — Ambulatory Visit
Admission: EM | Admit: 2023-12-06 | Discharge: 2023-12-06 | Disposition: A | Discharge status: Home / Self Care | Attending: Family Medicine | Admitting: Family Medicine

## 2023-12-06 ENCOUNTER — Ambulatory Visit (INDEPENDENT_AMBULATORY_CARE_PROVIDER_SITE_OTHER)

## 2023-12-06 DIAGNOSIS — R051 Acute cough: Secondary | ICD-10-CM | POA: Diagnosis not present

## 2023-12-06 DIAGNOSIS — R062 Wheezing: Secondary | ICD-10-CM | POA: Diagnosis not present

## 2023-12-06 DIAGNOSIS — R0602 Shortness of breath: Secondary | ICD-10-CM | POA: Diagnosis not present

## 2023-12-06 MED ORDER — METHYLPREDNISOLONE 4 MG PO TBPK: ORAL_TABLET | ORAL | 0 refills | Status: AC

## 2023-12-06 MED ORDER — ALBUTEROL SULFATE HFA 108 (90 BASE) MCG/ACT IN AERS: 1.0000 | INHALATION_SPRAY | Freq: Four times a day (QID) | RESPIRATORY_TRACT | 1 refills | Status: AC | PRN

## 2023-12-06 MED ORDER — PROMETHAZINE-DM 6.25-15 MG/5ML PO SYRP: 5.0000 mL | ORAL_SOLUTION | Freq: Four times a day (QID) | ORAL | 0 refills | Status: AC | PRN

## 2023-12-06 NOTE — ED Triage Notes (Signed)
"  I have been having this Cough that started with a cold a few wks ago, I got over that but this cough is continuing with wheezing/sob at times". No history of Asthma or COPD. "I have had bronchitis a lot". No fever.

## 2023-12-27 NOTE — ED Provider Notes (Signed)
 [facility]   [id number] 12/06/23 Arrival Time: 1716  ASSESSMENT & PLAN:  1. SOB (shortness of breath)   2. Acute cough   3. Wheezing    I have personally viewed and independently interpreted the imaging studies ordered this visit. CXR: no acute changes  Without resp distress. Begin: Meds ordered this encounter  Medications   methylPREDNISolone (MEDROL DOSEPAK) 4 MG TBPK tablet    Sig: Take as directed.    Dispense:  1 each    Refill:  0   promethazine-dextromethorphan (PROMETHAZINE-DM) 6.25-15 MG/5ML syrup    Sig: Take 5 mLs by mouth 4 (four) times daily as needed for cough.    Dispense:  118 mL    Refill:  0   albuterol (VENTOLIN HFA) 108 (90 Base) MCG/ACT inhaler    Sig: Inhale 1-2 puffs into the lungs every 6 (six) hours as needed for wheezing or shortness of breath.    Dispense:  1 each    Refill:  1     Follow-up Information     [facility]).   Specialty: Urgent Care Why: If worsening or failing to improve as anticipated. Contact information: [address] [place] [address] [phone]                Reviewed expectations re: course of current medical issues. Questions answered. Outlined signs and symptoms indicating need for more acute intervention. Understanding verbalized. After Visit Summary given.   SUBJECTIVE: History from: Patient. Derrick Hines is a 28 y.o. male. "I have been having this Cough that started with a cold a few wks ago, I got over that but this cough is continuing with wheezing/sob at times". No history of Asthma or COPD. "I have had bronchitis a lot". No fever.  Denies: fever. Normal PO intake without n/v/d.  OBJECTIVE:  Vitals:   12/06/23 1732 12/06/23 1734 12/06/23 1738  BP:  113/76   Pulse:  83   Resp:  18   Temp:   (!) 97.5 F (36.4 C)  TempSrc:   Temporal  SpO2:  96%   Weight: 113.4 kg    Height: 5\' 11"  (1.803 m)      General  appearance: alert; no distress Eyes: PERRLA; EOMI; conjunctiva normal HENT: [place]; AT; with nasal congestion Neck: supple  Lungs: speaks full sentences without difficulty; unlabored; bilat exp wheezing Extremities: no edema Skin: warm and dry Neurologic: normal gait Psychological: alert and cooperative; normal mood and affect   Imaging: DG Chest 2 View Result Date: 12/06/2023 CLINICAL DATA:  Cough, wheezing, and shortness of breath for several weeks. EXAM: CHEST - 2 VIEW COMPARISON:  11/27/2022 FINDINGS: The heart size and mediastinal contours are within normal limits. Both lungs are clear. The visualized skeletal structures are unremarkable. IMPRESSION: Normal exam. Electronically Signed   By: Danae Orleans M.D.   On: 12/06/2023 21:23    No Known Allergies  Past Medical History:  Diagnosis Date   Colitis    Social History   Socioeconomic History   Marital status: Single    Spouse name: Not on file   Number of children: Not on file   Years of education: Not on file   Highest education level: Not on file  Occupational History   Not on file  Tobacco Use   Smoking status: Never   Smokeless tobacco: Never  Vaping Use   Vaping status: Never Used  Substance and Sexual Activity   Alcohol use: Yes  Comment: Occassionally.   Drug use: Not Currently   Sexual activity: Not Currently  Other Topics Concern   Not on file  Social History Narrative   Not on file   Social Drivers of Health   Financial Resource Strain: Not on file  Food Insecurity: Not on file  Transportation Needs: Not on file  Physical Activity: Not on file  Stress: Not on file  Social Connections: Not on file  Intimate Partner Violence: Not on file   Family History  Problem Relation Age of Onset   Cancer - Lung Maternal Uncle    History reviewed. No pertinent surgical history.   Mardella Layman, MD 12/27/23 [phone]
